# Patient Record
Sex: Female | Born: 1960 | Race: White | Hispanic: No | Smoking: Current every day smoker
Health system: Southern US, Community
[De-identification: ages and names within clinical notes are randomized; demographics above are authoritative.]

## PROBLEM LIST (undated history)

## (undated) DIAGNOSIS — J302 Other seasonal allergic rhinitis: Secondary | ICD-10-CM

## (undated) DIAGNOSIS — M1990 Unspecified osteoarthritis, unspecified site: Secondary | ICD-10-CM

## (undated) DIAGNOSIS — R5383 Other fatigue: Secondary | ICD-10-CM

## (undated) HISTORY — DX: Unspecified osteoarthritis, unspecified site: M19.90

## (undated) HISTORY — DX: Other seasonal allergic rhinitis: J30.2

## (undated) HISTORY — DX: Other fatigue: R53.83

---

## 2000-02-28 HISTORY — PX: ABDOMINAL HYSTERECTOMY: SHX81

## 2000-10-25 ENCOUNTER — Inpatient Hospital Stay (HOSPITAL_COMMUNITY): Admission: AD | Admit: 2000-10-25 | Discharge: 2000-10-27 | Payer: Self-pay | Admitting: *Deleted

## 2003-12-31 ENCOUNTER — Emergency Department (HOSPITAL_COMMUNITY): Admission: EM | Admit: 2003-12-31 | Discharge: 2003-12-31 | Payer: Self-pay | Admitting: Emergency Medicine

## 2004-07-19 ENCOUNTER — Ambulatory Visit (HOSPITAL_COMMUNITY): Admission: RE | Admit: 2004-07-19 | Discharge: 2004-07-19 | Payer: Self-pay | Admitting: Family Medicine

## 2004-07-22 ENCOUNTER — Ambulatory Visit (HOSPITAL_COMMUNITY): Admission: RE | Admit: 2004-07-22 | Discharge: 2004-07-22 | Payer: Self-pay | Admitting: Family Medicine

## 2011-08-12 ENCOUNTER — Ambulatory Visit (HOSPITAL_COMMUNITY)
Admission: RE | Admit: 2011-08-12 | Discharge: 2011-08-12 | Disposition: A | Discharge status: Home / Self Care | Payer: BC Managed Care – PPO | Source: Ambulatory Visit | Attending: Family Medicine | Admitting: Family Medicine

## 2011-08-12 ENCOUNTER — Other Ambulatory Visit (HOSPITAL_COMMUNITY): Payer: Self-pay | Admitting: Family Medicine

## 2011-08-12 DIAGNOSIS — T1490XA Injury, unspecified, initial encounter: Secondary | ICD-10-CM

## 2011-08-12 DIAGNOSIS — X58XXXA Exposure to other specified factors, initial encounter: Secondary | ICD-10-CM | POA: Insufficient documentation

## 2011-08-12 DIAGNOSIS — M79609 Pain in unspecified limb: Secondary | ICD-10-CM | POA: Insufficient documentation

## 2011-08-12 DIAGNOSIS — S62319A Displaced fracture of base of unspecified metacarpal bone, initial encounter for closed fracture: Secondary | ICD-10-CM | POA: Insufficient documentation

## 2011-10-25 ENCOUNTER — Encounter: Payer: Self-pay | Admitting: Internal Medicine

## 2011-11-23 ENCOUNTER — Ambulatory Visit (AMBULATORY_SURGERY_CENTER): Payer: BC Managed Care – PPO | Admitting: *Deleted

## 2011-11-23 ENCOUNTER — Encounter: Payer: Self-pay | Admitting: Internal Medicine

## 2011-11-23 VITALS — Ht 68.0 in | Wt 182.1 lb

## 2011-11-23 DIAGNOSIS — Z1211 Encounter for screening for malignant neoplasm of colon: Secondary | ICD-10-CM

## 2011-11-23 MED ORDER — MOVIPREP 100 G PO SOLR: 1.0000 | Freq: Once | ORAL | Status: DC

## 2011-12-06 ENCOUNTER — Ambulatory Visit (AMBULATORY_SURGERY_CENTER): Payer: BC Managed Care – PPO | Admitting: Internal Medicine

## 2011-12-06 ENCOUNTER — Encounter: Payer: Self-pay | Admitting: Internal Medicine

## 2011-12-06 VITALS — BP 127/96 | HR 64 | Temp 97.7°F | Resp 16 | Ht 68.0 in | Wt 182.0 lb

## 2011-12-06 DIAGNOSIS — D126 Benign neoplasm of colon, unspecified: Secondary | ICD-10-CM

## 2011-12-06 DIAGNOSIS — Z1211 Encounter for screening for malignant neoplasm of colon: Secondary | ICD-10-CM

## 2011-12-06 MED ORDER — SODIUM CHLORIDE 0.9 % IV SOLN: 500.0000 mL | INTRAVENOUS | Status: DC

## 2011-12-06 NOTE — Progress Notes (Signed)
No complaints noted in the recovery room. Maw   

## 2011-12-06 NOTE — Op Note (Signed)
[facility] [address] [place], [address]   COLONOSCOPY PROCEDURE REPORT  PATIENT: Jackie Hinton, Jackie Hinton  MR#: [id number] BIRTHDATE: [birth year] , 51  yrs. old GENDER: Female ENDOSCOPIST: Hart Carwin, MD REFERRED BY:  Karleen Hampshire, M.D. PROCEDURE DATE:  12/06/2011 PROCEDURE:   Colonoscopy with snare polypectomy ASA CLASS:   Class I INDICATIONS:average risk patient for colon cancer. MEDICATIONS: MAC sedation, administered by CRNA and Propofol (Diprivan) 350 mg IV  DESCRIPTION OF PROCEDURE:   After the risks and benefits and of the procedure were explained, informed consent was obtained.  A digital rectal exam revealed no abnormalities of the rectum.    The LB [id number]  endoscope was introduced through the anus and advanced to the cecum, which was identified by both the appendix and ileocecal valve .  The quality of the prep was good, using MoviPrep .  The instrument was then slowly withdrawn as the colon was fully examined.     COLON FINDINGS: A smooth sessile polyp ranging between 5-21mm in size was found in the sigmoid colon.  A polypectomy was performed with a cold snare.  The resection was complete and the polyp tissue was partially retrieved.   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     Retroflexed views revealed no abnormalities.     The scope was then withdrawn from the patient and the procedure completed.  COMPLICATIONS: There were no complications. ENDOSCOPIC IMPRESSION: 1.   Sessile polyp ranging between 5-95mm in size was found in the sigmoid colon at 25 cm, polypectomy was performed with a cold snare , polyp not recovered, the postpolypectomy site was biopsied to r/o dysplasia 2.   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  await pathology results 2.  High fiber diet   REPEAT EXAM: In 5 year(s)  for Colonoscopy.  cc:  _______________________________ eSignedHart Carwin, MD 12/06/2011 11:36 AM     PATIENT NAME:  Jackie Hinton, Jackie Hinton MR#: [id number]

## 2011-12-06 NOTE — Patient Instructions (Addendum)
Handouts were given to your care partner on polyp and high fiber diet.  You may resume your current medications today.  Please call if any questions or concerns.    YOU HAD AN ENDOSCOPIC PROCEDURE TODAY AT THE [facility]: Refer to the procedure report that was given to you for any specific questions about what was found during the examination.  If the procedure report does not answer your questions, please call your gastroenterologist to clarify.  If you requested that your care partner not be given the details of your procedure findings, then the procedure report has been included in a sealed envelope for you to review at your convenience later.  YOU SHOULD EXPECT: Some feelings of bloating in the abdomen. Passage of more gas than usual.  Walking can help get rid of the air that was put into your GI tract during the procedure and reduce the bloating. If you had a lower endoscopy (such as a colonoscopy or flexible sigmoidoscopy) you may notice spotting of blood in your stool or on the toilet paper. If you underwent a bowel prep for your procedure, then you may not have a normal bowel movement for a few days.  DIET: Your first meal following the procedure should be a light meal and then it is ok to progress to your normal diet.  A half-sandwich or bowl of soup is an example of a good first meal.  Heavy or fried foods are harder to digest and may make you feel nauseous or bloated.  Likewise meals heavy in dairy and vegetables can cause extra gas to form and this can also increase the bloating.  Drink plenty of fluids but you should avoid alcoholic beverages for 24 hours.  ACTIVITY: Your care partner should take you home directly after the procedure.  You should plan to take it easy, moving slowly for the rest of the day.  You can resume normal activity the day after the procedure however you should NOT DRIVE or use heavy machinery for 24 hours (because of the sedation medicines used during the  test).    SYMPTOMS TO REPORT IMMEDIATELY: A gastroenterologist can be reached at any hour.  During normal business hours, 8:30 AM to 5:00 PM Monday through Friday, call [phone].  After hours and on weekends, please call the GI answering service at [phone] who will take a message and have the physician on call contact you.   Following lower endoscopy (colonoscopy or flexible sigmoidoscopy):  Excessive amounts of blood in the stool  Significant tenderness or worsening of abdominal pains  Swelling of the abdomen that is new, acute  Fever of 100F or higher    FOLLOW UP: If any biopsies were taken you will be contacted by phone or by letter within the next 1-3 weeks.  Call your gastroenterologist if you have not heard about the biopsies in 3 weeks.  Our staff will call the home number listed on your records the next business day following your procedure to check on you and address any questions or concerns that you may have at that time regarding the information given to you following your procedure. This is a courtesy call and so if there is no answer at the home number and we have not heard from you through the emergency physician on call, we will assume that you have returned to your regular daily activities without incident.  SIGNATURES/CONFIDENTIALITY: You and/or your care partner have signed paperwork which will be entered into your  electronic medical record.  These signatures attest to the fact that that the information above on your After Visit Summary has been reviewed and is understood.  Full responsibility of the confidentiality of this discharge information lies with you and/or your care-partner.

## 2011-12-06 NOTE — Progress Notes (Signed)
Patient did not experience any of the following events: a burn prior to discharge; a fall within the facility; wrong site/side/patient/procedure/implant event; or a hospital transfer or hospital admission upon discharge from the facility. (G8907) Patient did not have preoperative order for IV antibiotic SSI prophylaxis. (G8918)  

## 2011-12-07 ENCOUNTER — Telehealth: Payer: Self-pay | Admitting: *Deleted

## 2011-12-07 NOTE — Telephone Encounter (Signed)
  Follow up Call-  Call back number 12/06/2011  Post procedure Call Back phone  # [phone]  Permission to leave phone message Yes     Patient questions:  Do you have a fever, pain , or abdominal swelling? no Pain Score  0 *  Have you tolerated food without any problems? yes  Have you been able to return to your normal activities? yes  Do you have any questions about your discharge instructions: Diet   no Medications  no Follow up visit  no  Do you have questions or concerns about your Care? no  Actions: * If pain score is 4 or above: No action needed, pain <4.

## 2011-12-11 ENCOUNTER — Encounter: Payer: Self-pay | Admitting: Internal Medicine

## 2014-09-03 ENCOUNTER — Encounter: Payer: Self-pay | Admitting: Adult Health

## 2014-09-03 ENCOUNTER — Ambulatory Visit (INDEPENDENT_AMBULATORY_CARE_PROVIDER_SITE_OTHER): Payer: BLUE CROSS/BLUE SHIELD | Admitting: Adult Health

## 2014-09-03 VITALS — BP 120/68 | HR 76 | Ht 66.5 in | Wt 201.0 lb

## 2014-09-03 DIAGNOSIS — Z1212 Encounter for screening for malignant neoplasm of rectum: Secondary | ICD-10-CM | POA: Diagnosis not present

## 2014-09-03 DIAGNOSIS — R5383 Other fatigue: Secondary | ICD-10-CM

## 2014-09-03 DIAGNOSIS — Z01419 Encounter for gynecological examination (general) (routine) without abnormal findings: Secondary | ICD-10-CM

## 2014-09-03 HISTORY — DX: Other fatigue: R53.83

## 2014-09-03 LAB — HEMOCCULT GUIAC POC 1CARD (OFFICE): FECAL OCCULT BLD: NEGATIVE

## 2014-09-03 NOTE — Progress Notes (Signed)
Patient ID: Jackie Hinton, female   DOB: 06/14/1960, 54 y.o.   MRN: [id number] History of Present Illness: Jackie Hinton is a 54 year old white female, divorced in for well woman gyn exam,she is sp hysterectomy.Had hysterectomy in 2003 by Dr Isaiah Blakes for fibroids. PCP is Dr Hilma Favors.  Current Medications, Allergies, Past Medical History, Past Surgical History, Family History and Social History were reviewed in [employer] record.     Review of Systems: Patient denies any headaches, hearing loss, blurred vision, shortness of breath, chest pain, abdominal pain, problems with bowel movements, urination, or intercourse(not having sex). No joint pain or mood swings.Has fatigue but works 8-11 hours per day.   Physical Exam:BP 120/68 mmHg  Pulse 76  Ht 5' 6.5" (1.689 m)  Wt 201 lb (91.173 kg)  BMI 31.96 kg/m2 General:  Well developed, well nourished, no acute distress Skin:  Warm and dry Neck:  Midline trachea, normal thyroid, good ROM, no lymphadenopathy Lungs; Clear to auscultation bilaterally Breast:  No dominant palpable mass, retraction, or nipple discharge Cardiovascular: Regular rate and rhythm Abdomen:  Soft, non tender, no hepatosplenomegaly Pelvic:  External genitalia is normal in appearance, except has numerous sebaceous cysts.  The vagina has decreased color, moisture and rugae.Marland Kitchen Urethra has no lesions or masses. The cervix and uterus are absent.  No adnexal masses or tenderness noted.Bladder is non tender, no masses felt. Rectal: Good sphincter tone, no polyps, internal hemorrhoids felt.  Hemoccult negative. Extremities/musculoskeletal:  No swelling or varicosities noted, no clubbing or cyanosis Psych:  No mood changes, alert and cooperative,seems happy   Impression:  Well woman gyn exam no pap Fatigue    Plan: Physical in 1 year Mammogram now and yearly(past due) Colonoscopy per GI Check CBC,CMP,TSH and lipids

## 2014-09-03 NOTE — Patient Instructions (Signed)
Get mammogram now Physical in 1 year Will talk when labs back

## 2014-09-05 LAB — COMPREHENSIVE METABOLIC PANEL
ALBUMIN: 4.2 g/dL (ref 3.5–5.5)
ALT: 32 IU/L (ref 0–32)
AST: 30 IU/L (ref 0–40)
Albumin/Globulin Ratio: 2 (ref 1.1–2.5)
Alkaline Phosphatase: 79 IU/L (ref 39–117)
BILIRUBIN TOTAL: 0.5 mg/dL (ref 0.0–1.2)
BUN/Creatinine Ratio: 19 (ref 9–23)
BUN: 16 mg/dL (ref 6–24)
CALCIUM: 9.1 mg/dL (ref 8.7–10.2)
CHLORIDE: 107 mmol/L (ref 97–108)
CO2: 22 mmol/L (ref 18–29)
Creatinine, Ser: 0.86 mg/dL (ref 0.57–1.00)
GFR calc non Af Amer: 77 mL/min/{1.73_m2} (ref >59)
GFR, EST AFRICAN AMERICAN: 89 mL/min/{1.73_m2} (ref >59)
GLUCOSE: 89 mg/dL (ref 65–99)
Globulin, Total: 2.1 g/dL (ref 1.5–4.5)
POTASSIUM: 4.5 mmol/L (ref 3.5–5.2)
Sodium: 145 mmol/L — ABNORMAL HIGH (ref 134–144)
TOTAL PROTEIN: 6.3 g/dL (ref 6.0–8.5)

## 2014-09-05 LAB — CBC
HEMATOCRIT: 36.9 % (ref 34.0–46.6)
Hemoglobin: 12.6 g/dL (ref 11.1–15.9)
MCH: 30.9 pg (ref 26.6–33.0)
MCHC: 34.1 g/dL (ref 31.5–35.7)
MCV: 90 fL (ref 79–97)
Platelets: 303 10*3/uL (ref 150–379)
RBC: 4.08 x10E6/uL (ref 3.77–5.28)
RDW: 13.9 % (ref 12.3–15.4)
WBC: 6.5 10*3/uL (ref 3.4–10.8)

## 2014-09-05 LAB — TSH: TSH: 1.8 u[IU]/mL (ref 0.450–4.500)

## 2014-09-05 LAB — LIPID PANEL W/O CHOL/HDL RATIO
CHOLESTEROL TOTAL: 212 mg/dL — AB (ref 100–199)
HDL: 74 mg/dL (ref >39)
LDL Calculated: 122 mg/dL — ABNORMAL HIGH (ref 0–99)
TRIGLYCERIDES: 81 mg/dL (ref 0–149)
VLDL Cholesterol Cal: 16 mg/dL (ref 5–40)

## 2014-09-07 ENCOUNTER — Telehealth: Payer: Self-pay | Admitting: Adult Health

## 2014-09-07 NOTE — Telephone Encounter (Signed)
Left message about labs

## 2014-09-21 ENCOUNTER — Other Ambulatory Visit (HOSPITAL_COMMUNITY): Payer: Self-pay | Admitting: Internal Medicine

## 2014-09-21 DIAGNOSIS — Z1231 Encounter for screening mammogram for malignant neoplasm of breast: Secondary | ICD-10-CM

## 2014-10-05 ENCOUNTER — Ambulatory Visit (HOSPITAL_COMMUNITY)
Admission: RE | Admit: 2014-10-05 | Discharge: 2014-10-05 | Disposition: A | Discharge status: Home / Self Care | Payer: BLUE CROSS/BLUE SHIELD | Source: Ambulatory Visit | Attending: Internal Medicine | Admitting: Internal Medicine

## 2014-10-05 DIAGNOSIS — Z1231 Encounter for screening mammogram for malignant neoplasm of breast: Secondary | ICD-10-CM | POA: Diagnosis not present

## 2016-09-05 IMAGING — MG MM DIGITAL SCREENING BILAT W/ CAD
4 series · 4 of 4 positions shown · non-contrast
Comparison: None.

CLINICAL DATA: Screening.

EXAM:
DIGITAL SCREENING BILATERAL MAMMOGRAM WITH CAD

[R MLO]
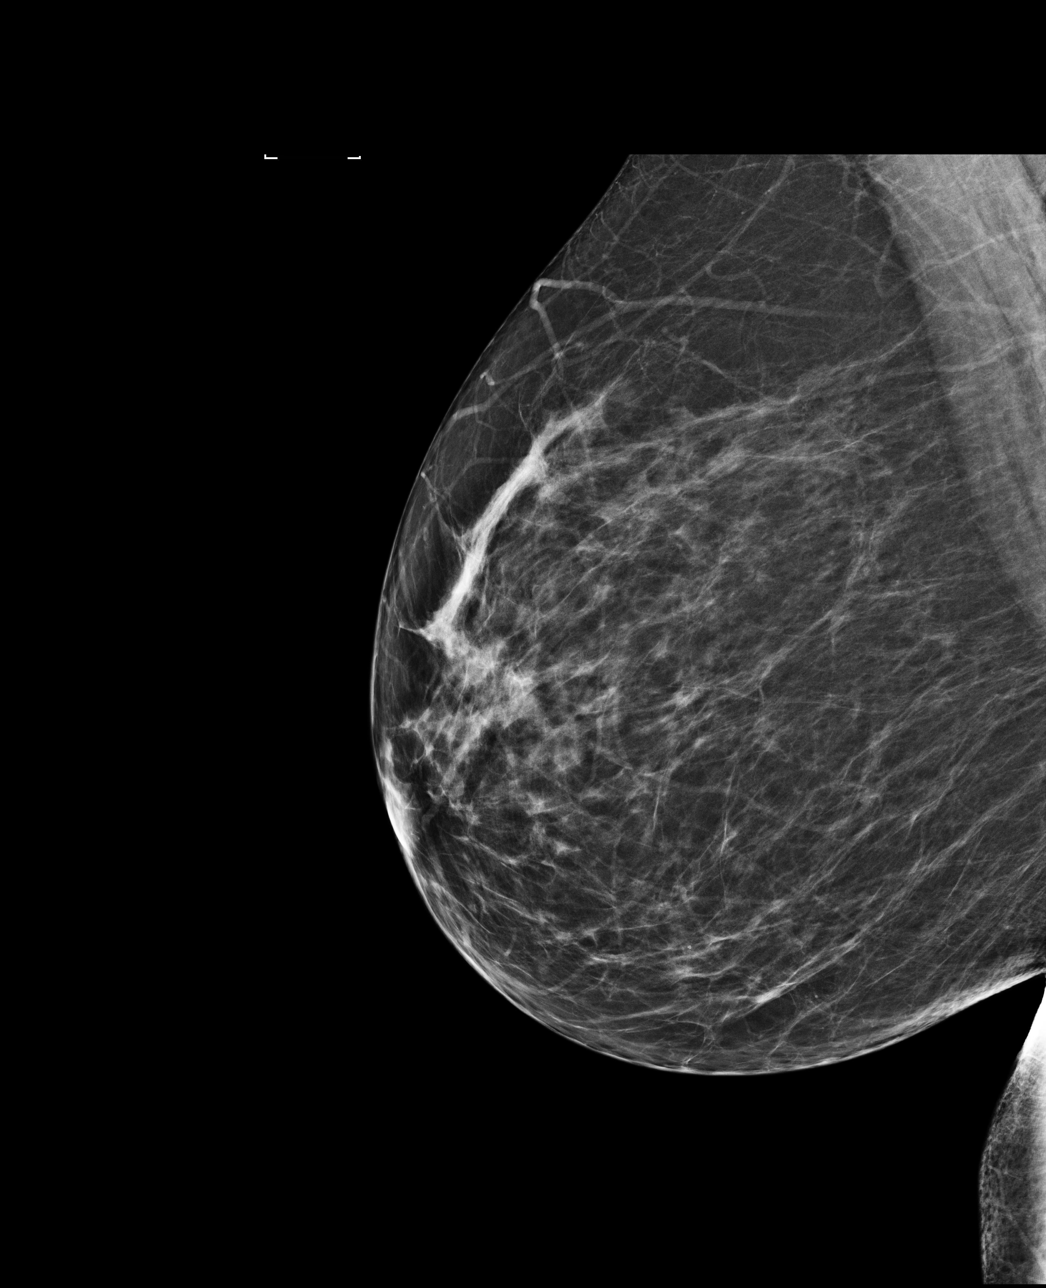

[R CC]
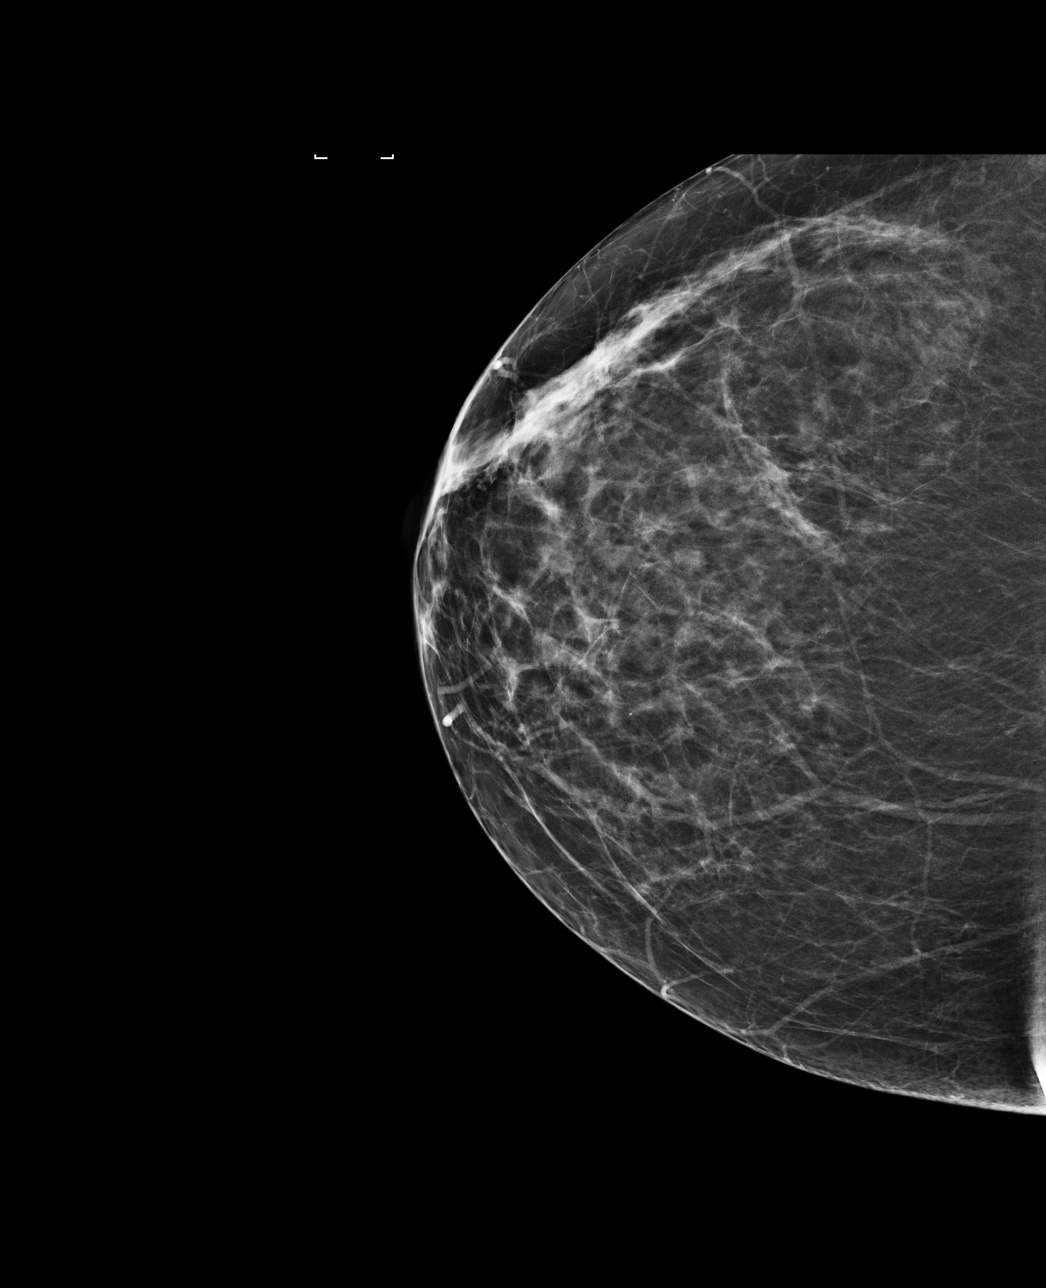

[L CC]
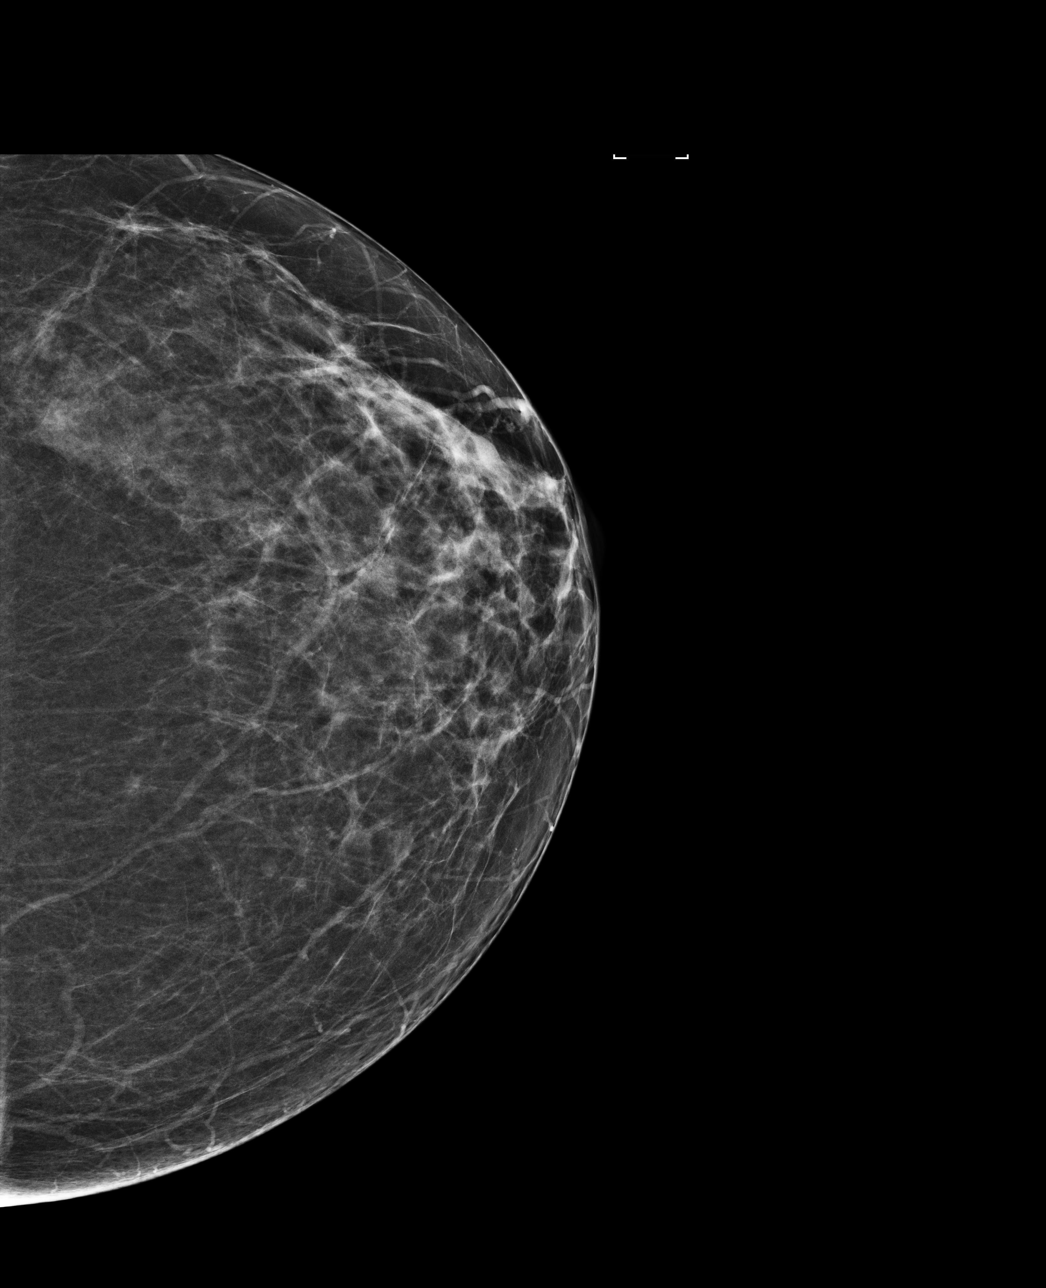

[L MLO]
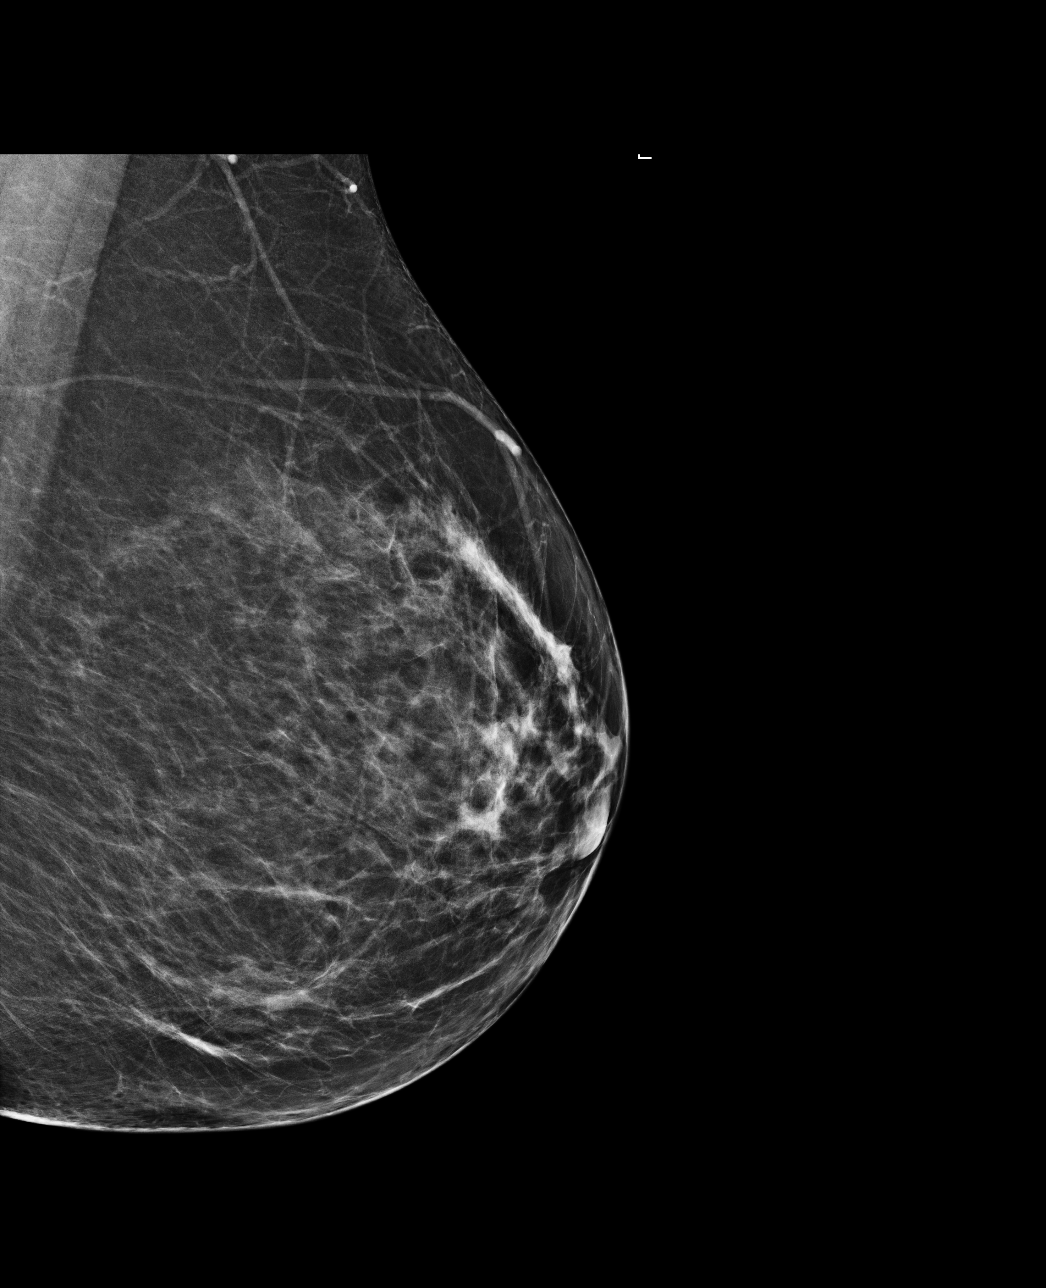

[4 of 4 positions shown; findings below may reference images not displayed]

ACR Breast Density Category b: There are scattered areas of
fibroglandular density.
FINDINGS: There are no findings suspicious for malignancy. Images were
processed with CAD.
IMPRESSION: No mammographic evidence of malignancy. A result letter of this
screening mammogram will be mailed directly to the patient.

RECOMMENDATION:
Screening mammogram in one year. (Code:[id number])

BI-RADS CATEGORY  1: Negative.

## 2017-01-10 DIAGNOSIS — Z0000 Encounter for general adult medical examination without abnormal findings: Secondary | ICD-10-CM | POA: Diagnosis not present

## 2017-03-31 DIAGNOSIS — J019 Acute sinusitis, unspecified: Secondary | ICD-10-CM | POA: Diagnosis not present

## 2017-03-31 DIAGNOSIS — J209 Acute bronchitis, unspecified: Secondary | ICD-10-CM | POA: Diagnosis not present

## 2017-12-24 ENCOUNTER — Ambulatory Visit
Admission: RE | Admit: 2017-12-24 | Discharge: 2017-12-24 | Disposition: A | Discharge status: Home / Self Care | Payer: 59 | Source: Ambulatory Visit | Attending: Family Medicine | Admitting: Family Medicine

## 2017-12-24 ENCOUNTER — Other Ambulatory Visit: Payer: Self-pay | Admitting: Family Medicine

## 2017-12-24 DIAGNOSIS — Z1231 Encounter for screening mammogram for malignant neoplasm of breast: Secondary | ICD-10-CM

## 2018-02-11 DIAGNOSIS — J0110 Acute frontal sinusitis, unspecified: Secondary | ICD-10-CM | POA: Diagnosis not present

## 2018-04-30 DIAGNOSIS — Z6833 Body mass index (BMI) 33.0-33.9, adult: Secondary | ICD-10-CM | POA: Diagnosis not present

## 2020-09-14 ENCOUNTER — Other Ambulatory Visit: Payer: Self-pay | Admitting: Family Medicine

## 2020-09-14 DIAGNOSIS — G500 Trigeminal neuralgia: Secondary | ICD-10-CM

## 2020-09-19 ENCOUNTER — Other Ambulatory Visit: Payer: Self-pay

## 2020-09-19 ENCOUNTER — Ambulatory Visit
Admission: RE | Admit: 2020-09-19 | Discharge: 2020-09-19 | Disposition: A | Discharge status: Home / Self Care | Payer: 59 | Source: Ambulatory Visit | Attending: Family Medicine | Admitting: Family Medicine

## 2020-09-19 DIAGNOSIS — G500 Trigeminal neuralgia: Secondary | ICD-10-CM

## 2020-09-19 MED ORDER — GADOBENATE DIMEGLUMINE 529 MG/ML IV SOLN
20.0000 mL | Freq: Once | INTRAVENOUS | Status: AC | PRN
  Administered 2020-09-19: 20 mL via INTRAVENOUS

## 2021-02-28 ENCOUNTER — Emergency Department (HOSPITAL_COMMUNITY)
Admission: EM | Admit: 2021-02-28 | Discharge: 2021-02-28 | Disposition: A | Discharge status: Home / Self Care | Payer: 59 | Attending: Emergency Medicine | Admitting: Emergency Medicine

## 2021-02-28 ENCOUNTER — Encounter (HOSPITAL_COMMUNITY): Payer: Self-pay | Admitting: *Deleted

## 2021-02-28 DIAGNOSIS — R519 Headache, unspecified: Secondary | ICD-10-CM | POA: Diagnosis present

## 2021-02-28 DIAGNOSIS — G500 Trigeminal neuralgia: Secondary | ICD-10-CM | POA: Insufficient documentation

## 2021-02-28 MED ORDER — CARBAMAZEPINE 200 MG PO TABS
200.0000 mg | ORAL_TABLET | Freq: Once | ORAL | Status: AC
Start: 2021-02-28 — End: 2021-02-28
  Administered 2021-02-28: 200 mg via ORAL
  Filled 2021-02-28: qty 1

## 2021-02-28 MED ORDER — CARBAMAZEPINE 200 MG PO TABS: 200.0000 mg | ORAL_TABLET | Freq: Two times a day (BID) | ORAL | 0 refills | Status: DC

## 2021-02-28 NOTE — ED Provider Notes (Signed)
[facility] EMERGENCY DEPARTMENT Provider Note  CSN: [id number] Arrival date & time: 02/28/21 1648  History Chief Complaint  Patient presents with   Facial Pain    Jackie Hinton is a 61 y.o. female with prior diagnosis of trigeminal neuralgia managed by a doctor in [place], taking Gabapentin for same reports she was doing well until yesterday when she had onset of worsening pain similar to previous however she points to the back of her head as the origin of the pain which then radiates up and over the top of her head and into her R face. She reports some increased tearing, pain is worse with talking/chewing. She also has TMJ. No fevers, no blurry vision, no injuries.    Home Medications Prior to Admission medications   Medication Sig Start Date End Date Taking? Authorizing Provider  carbamazepine (TEGRETOL) 200 MG tablet Take 1 tablet (200 mg total) by mouth 2 (two) times daily. 02/28/21 03/30/21 Yes Truddie Hidden, MD  clobetasol cream (TEMOVATE) 3.87 % 1 application 2 (two) times daily.  10/11/11   [provider]  ibuprofen (ADVIL,MOTRIN) 200 MG tablet Take 200 mg by mouth every 6 (six) hours as needed.    [provider]     Allergies    Patient has no known allergies.   Review of Systems   Review of Systems Please see HPI for pertinent positives and negatives  Physical Exam BP (!) 166/79 (BP Location: Right Arm)    Pulse 82    Temp 97.7 F (36.5 C) (Oral)    Resp 16    SpO2 97%   Physical Exam Vitals and nursing note reviewed.  Constitutional:      Appearance: Normal appearance.  HENT:     Head: Normocephalic and atraumatic.     Nose: Nose normal.     Mouth/Throat:     Mouth: Mucous membranes are moist.  Eyes:     Extraocular Movements: Extraocular movements intact.     Conjunctiva/sclera: Conjunctivae normal.  Cardiovascular:     Rate and Rhythm: Normal rate.  Pulmonary:     Effort: Pulmonary effort is normal.     Breath sounds: Normal breath  sounds.  Abdominal:     General: Abdomen is flat.     Palpations: Abdomen is soft.     Tenderness: There is no abdominal tenderness.  Musculoskeletal:        General: No swelling. Normal range of motion.     Cervical back: Neck supple.  Skin:    General: Skin is warm and dry.  Neurological:     General: No focal deficit present.     Mental Status: She is alert.  Psychiatric:        Mood and Affect: Mood normal.    ED Results / Procedures / Treatments   EKG None  Procedures Procedures  Medications Ordered in the ED Medications  carbamazepine (TEGRETOL) tablet 200 mg (has no administration in time range)    Initial Impression and Plan  Patient with prior history of trigeminal neuralgia although her symptoms also have component more consistent with occipital neuralgia. She had an outpatient MRI done in July 2022 at [facility] showing:  "IMPRESSION: 1. Small vessel closely approximating the right trigeminal root entry zone (series 14, image 42), which is nonspecific but represents a potential cause of trigeminal neuralgia in this patient with right-sided facial pain. High-resolution imaging through the brainstem/trigeminal nerves could further characterize if clinically indicated. 2. Partially empty sella, which is  often a normal anatomic variant but can be associated with idiopathic intracranial hypertension (pseudotumor cerebri)."  She reports this has not been followed up on. She has never been given Tegretol for her pain. She does not see a neurologist.   ED Course   Clinical Course as of 02/28/21 2050  Mon Feb 28, 2021  2046 Discussed with the patient that her symptoms may be a combination of occipital neuralgia, trigeminal neuralgia and/or TMJ. She has not been on Tegretol in the past so will begin that to see if she gets some relief. She would also likely benefit from Neurology evaluation, referred to Dr. Merlene Laughter. RTED for any other concerns.  [CS]    Clinical  Course User Index [CS] Truddie Hidden, MD     MDM Rules/Calculators/A&P Medical Decision Making Problems Addressed: Trigeminal neuralgia of right side of face: chronic illness or injury with exacerbation, progression, or side effects of treatment  Amount and/or Complexity of Data Reviewed External Data Reviewed: radiology.  Risk Prescription drug management.    Final Clinical Impression(s) / ED Diagnoses Final diagnoses:  Trigeminal neuralgia of right side of face    Rx / DC Orders ED Discharge Orders          Ordered    carbamazepine (TEGRETOL) 200 MG tablet  2 times daily        02/28/21 2049             Truddie Hidden, MD 02/28/21 2050

## 2021-02-28 NOTE — ED Triage Notes (Signed)
Facial pain, history of trigeminal neuralgia

## 2021-04-18 ENCOUNTER — Ambulatory Visit: Payer: BLUE CROSS/BLUE SHIELD | Admitting: Neurology

## 2021-10-11 ENCOUNTER — Telehealth: Payer: Self-pay | Admitting: Orthopedic Surgery

## 2021-10-11 NOTE — Telephone Encounter (Signed)
Patient left a message requesting to schedule appointment with Dr Aline Brochure - reached voice mail, left message.

## 2021-10-11 NOTE — Telephone Encounter (Signed)
Patient called back, aware of appointment scheduled.

## 2021-11-07 ENCOUNTER — Ambulatory Visit (INDEPENDENT_AMBULATORY_CARE_PROVIDER_SITE_OTHER): Payer: 59 | Admitting: Orthopedic Surgery

## 2021-11-07 ENCOUNTER — Ambulatory Visit (INDEPENDENT_AMBULATORY_CARE_PROVIDER_SITE_OTHER): Payer: 59

## 2021-11-07 VITALS — BP 188/93 | HR 88 | Ht 68.0 in | Wt 240.0 lb

## 2021-11-07 DIAGNOSIS — M25562 Pain in left knee: Secondary | ICD-10-CM

## 2021-11-07 DIAGNOSIS — M1712 Unilateral primary osteoarthritis, left knee: Secondary | ICD-10-CM

## 2021-11-07 DIAGNOSIS — G8929 Other chronic pain: Secondary | ICD-10-CM

## 2021-11-07 NOTE — Progress Notes (Signed)
Chief Complaint  Patient presents with   Knee Pain    Left, pain past two months drove to [place] with one 45 minute stop and pain started after that    New patient evaluation  61 year old female drove to [place] for 8 hours only stopped once when she got out her knee started having pain and swelling she says it feels like the kneecap is moving.  This pain shoots up the back of her leg.  She denies any injury.  She took Advil that helped very little  Examination shows a well-developed well-nourished female alert and oriented x3 mood and affect normal  BP (!) 188/93   Pulse 88   Ht '5\' 8"'$  (1.727 m)   Wt 240 lb (108.9 kg)   BMI 36.49 kg/m   Her left knee has a mild effusion she is tender across the front of the knee and in the knee With crepitance on range of motion.  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  Recommend home exercise program for strengthening  She can start meloxicam  Reevaluate the knee in 4 weeks    Past Medical History:  Diagnosis Date   Arthritis    bilateral knees   Fatigue 09/03/2014   Seasonal allergies    allergic to cat as a child   Past Surgical History:  Procedure Laterality Date   ABDOMINAL HYSTERECTOMY  2002   [place], 1990   Family History  Problem Relation Age of Onset   Lung cancer Paternal Grandfather    Rectal cancer Neg Hx    Stomach cancer Neg Hx    Esophageal cancer Neg Hx    Colon cancer Neg Hx    COPD Mother    Emphysema Mother    Stroke Mother    COPD Father    Emphysema Father    Diabetes Father    Diabetes Sister    Hypertension Son    Bipolar disorder Sister    Social History   Tobacco Use   Smoking status: Every Day    Packs/day: 0.50    Years: 10.00    Total pack years: 5.00    Types: Cigarettes   Smokeless tobacco: Never  Substance Use  Topics   Alcohol use: Yes    Comment: very seldom   Drug use: No   No Known Allergies

## 2021-11-08 ENCOUNTER — Telehealth: Payer: Self-pay | Admitting: Orthopedic Surgery

## 2021-11-08 NOTE — Telephone Encounter (Signed)
Patient called about medication which she said  Dr Aline Brochure was going to prescribe per her office visit 11/07/21; states is not at her pharmacy, [facility] in [place]. Chart visit note shows: 'She can start meloxicam' - please advise.

## 2021-11-09 MED ORDER — MELOXICAM 7.5 MG PO TABS: 7.5000 mg | ORAL_TABLET | Freq: Every day | ORAL | 5 refills | Status: DC

## 2021-11-09 NOTE — Addendum Note (Signed)
Addended by: Carole Civil on: 11/09/2021 08:12 AM   Modules accepted: Orders

## 2021-11-21 ENCOUNTER — Other Ambulatory Visit: Payer: Self-pay | Admitting: Family Medicine

## 2021-11-21 DIAGNOSIS — Z1231 Encounter for screening mammogram for malignant neoplasm of breast: Secondary | ICD-10-CM

## 2021-11-28 ENCOUNTER — Ambulatory Visit
Admission: RE | Admit: 2021-11-28 | Discharge: 2021-11-28 | Disposition: A | Discharge status: Home / Self Care | Payer: 59 | Source: Ambulatory Visit | Attending: Family Medicine | Admitting: Family Medicine

## 2021-11-28 ENCOUNTER — Encounter: Payer: Self-pay | Admitting: Orthopedic Surgery

## 2021-11-28 ENCOUNTER — Ambulatory Visit (INDEPENDENT_AMBULATORY_CARE_PROVIDER_SITE_OTHER): Payer: 59 | Admitting: Orthopedic Surgery

## 2021-11-28 VITALS — Ht 68.0 in | Wt 236.4 lb

## 2021-11-28 DIAGNOSIS — M25562 Pain in left knee: Secondary | ICD-10-CM | POA: Diagnosis not present

## 2021-11-28 DIAGNOSIS — Z1231 Encounter for screening mammogram for malignant neoplasm of breast: Secondary | ICD-10-CM

## 2021-11-28 NOTE — Patient Instructions (Signed)
Smoking Tobacco Information, Adult Smoking tobacco can be harmful to your health. Tobacco contains a toxic colorless chemical called nicotine. Nicotine causes changes in your brain that make you want more and more. This is called addiction. This can make it hard to stop smoking once you start. Tobacco also has other toxic chemicals that can hurt your body and raise your risk of many cancers. Menthol or "lite" tobacco or cigarette brands are not safer than regular brands. How can smoking tobacco affect me? Smoking tobacco puts you at risk for: Cancer. Smoking is most commonly associated with lung cancer, but can also lead to cancer in other parts of the body. Chronic obstructive pulmonary disease (COPD). This is a long-term lung condition that makes it hard to breathe. It also gets worse over time. High blood pressure (hypertension), heart disease, stroke, heart attack, and lung infections, such as pneumonia. Cataracts. This is when the lenses in the eyes become clouded. Digestive problems. This may include peptic ulcers, heartburn, and gastroesophageal reflux disease (GERD). Oral health problems, such as gum disease, mouth sores, and tooth loss. Loss of taste and smell. Smoking also affects how you look and smell. Smoking may cause: Wrinkles. Yellow or stained teeth, fingers, and fingernails. Bad breath. Bad-smelling clothes and hair. Smoking tobacco can also affect your social life, because: It may be challenging to find places to smoke when away from home. Many workplaces, restaurants, hotels, and public places are tobacco-free. Smoking is expensive. This is due to the cost of tobacco and the long-term costs of treating health problems from smoking. Secondhand smoke may affect those around you. Secondhand smoke can cause lung cancer, breathing problems, and heart disease. Children of smokers have a higher risk for: Sudden infant death syndrome (SIDS). Ear infections. Lung infections. What  actions can I take to prevent health problems? Quit smoking  Do not start smoking. Quit if you already smoke. Do not replace cigarette smoking with vaping devices, such as e-cigarettes. Make a plan to quit smoking and commit to it. Look for programs to help you, and ask your health care provider for recommendations and ideas. Set a date and write down all the reasons you want to quit. Let your friends and family know you are quitting so they can help and support you. Consider finding friends who also want to quit. It can be easier to quit with someone else, so that you can support each other. Talk with your health care provider about using nicotine replacement medicines to help you quit. These include gum, lozenges, patches, sprays, or pills. If you try to quit but return to smoking, stay positive. It is common to slip up when you first quit, so take it one day at a time. Be prepared for cravings. When you feel the urge to smoke, chew gum or suck on hard candy. Lifestyle Stay busy. Take care of your body. Get plenty of exercise, eat a healthy diet, and drink plenty of water. Find ways to manage your stress, such as meditation, yoga, exercise, or time spent with friends and family. Ask your health care provider about having regular tests (screenings) to check for cancer. This may include blood tests, imaging tests, and other tests. Where to find support To get support to quit smoking, consider: Asking your health care provider for more information and resources. Joining a support group for people who want to quit smoking in your local community. There are many effective programs that may help you to quit. Calling the smokefree.gov counselor   helpline at 1-800-QUIT-NOW (1-800-784-8669). Where to find more information You may find more information about quitting smoking from: Centers for Disease Control and Prevention: cdc.gov/tobacco Smokefree.gov: smokefree.gov American Lung Association:  freedomfromsmoking.org Contact a health care provider if: You have problems breathing. Your lips, nose, or fingers turn blue. You have chest pain. You are coughing up blood. You feel like you will faint. You have other health changes that cause you to worry. Summary Smoking tobacco can negatively affect your health, the health of those around you, your finances, and your social life. Do not start smoking. Quit if you already smoke. If you need help quitting, ask your health care provider. Consider joining a support group for people in your local community who want to quit smoking. There are many effective programs that may help you to quit. This information is not intended to replace advice given to you by your health care provider. Make sure you discuss any questions you have with your health care provider. Document Revised: 02/08/2021 Document Reviewed: 02/08/2021 Elsevier Patient Education  2023 Elsevier Inc.  

## 2021-11-28 NOTE — Progress Notes (Signed)
Chief Complaint  Patient presents with   Knee Pain    L/ it feels so much better.   61 year old female drove to [place] for 8 hours only stopped once when she got out her knee started having pain and swelling she says it feels like the kneecap is moving.  This pain shoots up the back of her leg.  She denies any injury.  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   Acute pain of left knee Yes    Fu prn

## 2021-12-16 ENCOUNTER — Encounter: Payer: Self-pay | Admitting: Internal Medicine

## 2022-04-27 ENCOUNTER — Encounter: Payer: Self-pay | Admitting: Radiology

## 2022-06-04 ENCOUNTER — Other Ambulatory Visit: Payer: Self-pay | Admitting: Orthopedic Surgery

## 2022-08-24 ENCOUNTER — Encounter: Payer: Self-pay | Admitting: *Deleted

## 2022-11-21 ENCOUNTER — Other Ambulatory Visit: Payer: Self-pay | Admitting: Family Medicine

## 2022-11-21 DIAGNOSIS — Z1231 Encounter for screening mammogram for malignant neoplasm of breast: Secondary | ICD-10-CM

## 2022-12-04 ENCOUNTER — Telehealth: Payer: Self-pay | Admitting: Neurology

## 2022-12-04 NOTE — Telephone Encounter (Signed)
Pt rescheduled appt due to being out town.

## 2022-12-07 ENCOUNTER — Ambulatory Visit: Payer: 59 | Admitting: Neurology

## 2022-12-25 ENCOUNTER — Ambulatory Visit
Admission: RE | Admit: 2022-12-25 | Discharge: 2022-12-25 | Disposition: A | Discharge status: Home / Self Care | Payer: 59 | Source: Ambulatory Visit | Attending: Family Medicine | Admitting: Family Medicine

## 2022-12-25 DIAGNOSIS — Z1231 Encounter for screening mammogram for malignant neoplasm of breast: Secondary | ICD-10-CM

## 2023-01-23 ENCOUNTER — Other Ambulatory Visit: Payer: Self-pay | Admitting: Orthopedic Surgery

## 2023-02-27 ENCOUNTER — Encounter (INDEPENDENT_AMBULATORY_CARE_PROVIDER_SITE_OTHER): Payer: Self-pay | Admitting: *Deleted

## 2023-03-06 ENCOUNTER — Telehealth: Payer: Self-pay | Admitting: Neurology

## 2023-03-06 NOTE — Telephone Encounter (Signed)
 Pt called to verify appointment

## 2023-03-08 ENCOUNTER — Encounter: Payer: Self-pay | Admitting: *Deleted

## 2023-03-19 ENCOUNTER — Encounter: Payer: Self-pay | Admitting: Neurology

## 2023-03-19 ENCOUNTER — Ambulatory Visit (INDEPENDENT_AMBULATORY_CARE_PROVIDER_SITE_OTHER): Payer: 59 | Admitting: Neurology

## 2023-03-19 VITALS — BP 155/79 | HR 66 | Resp 15 | Ht 68.0 in | Wt 228.5 lb

## 2023-03-19 DIAGNOSIS — R519 Headache, unspecified: Secondary | ICD-10-CM | POA: Insufficient documentation

## 2023-03-19 MED ORDER — CARBAMAZEPINE 200 MG PO TABS: 200.0000 mg | ORAL_TABLET | Freq: Every day | ORAL | 6 refills | Status: DC

## 2023-03-19 NOTE — Progress Notes (Unsigned)
Chief Complaint  Patient presents with   New Patient (Initial Visit)    Rm15, alone, NP REFERRAL FROM [facility] [place] [phone]/[id number] neuralgia: former neuro md suspiciously closed and needs to establish care. Pt stated had right sided head dull pain like similar to foot going to sleep and occasional numbness        ASSESSMENT AND PLAN  Jackie Hinton is a 63 y.o. female   History of intermittent to right sided headache, transient sharp radiating pain,  The history are not typical for trigeminal neuralgia, have some migraine features MRI of the brain showed no significant abnormalities Refilled her Tegretol 200 mg daily, if she continue to do well, may consider slow tapering off  Will continue follow-up with her primary care, only return to clinic for recurrent symptoms,   DIAGNOSTIC DATA (LABS, IMAGING, TESTING) - I reviewed patient records, labs, notes, testing and imaging myself where available.   MEDICAL HISTORY:  Jackie Hinton, is a 63 year old female, seen in request by her primary care from [facility] Dr. Mayford Knife, [place] [facility] for evaluation of right facial pain, initial evaluation was on March 19, 2023   History is obtained from the patient and review of electronic medical records. I personally reviewed pertinent available imaging films in PACS.   PMHx of  Depression.   Over the past 2 years, she has intermittent right side pain, often involving right parietal region, feels numb, also with intermittent shooting sharp pain from the occipital region, was under the care of of Dr. Gerilyn Pilgrim in the past, was given the diagnosis of trigeminal neuralgia,  Sometimes flareup can be so severe, she presented to the emergency room January 2023, the pain was described at the back part of her head, radiating towards the top of her head, into her right face, some tearing, some TMJ joints pain  Had MRI of the brain with and without contrast  July 2022, small vessel closely approximating the right trigeminal nerve root entry zone, partial empty sella,  Over the years, she was treated with Tegretol, was on 200 mg twice a day, now only once a day, has been pain-free since 2023  But she is worried about tapering off the medication because the severity of the pain when it did occur  She denies a history of migraine headaches,  I am concerned about the long-term side effect of Tegretol, which is an enzyme inducer, recommended gabapentin, she worry about potential side effect, because of bad experience of other family members.    PHYSICAL EXAM:   Vitals:   03/19/23 1511  BP: (!) 155/79  Pulse: 66  Resp: 15  Weight: 228 lb 8 oz (103.6 kg)  Height: 5\' 8"  (1.727 m)      Body mass index is 34.74 kg/m.  PHYSICAL EXAMNIATION:  Gen: NAD, conversant, well nourised, well groomed                     Cardiovascular: Regular rate rhythm, no peripheral edema, warm, nontender. Eyes: Conjunctivae clear without exudates or hemorrhage Neck: Supple, no carotid bruits. Pulmonary: Clear to auscultation bilaterally   NEUROLOGICAL EXAM:  MENTAL STATUS: Speech/cognition: Awake, alert, oriented to history taking and casual conversation CRANIAL NERVES: CN II: Visual fields are full to confrontation. Pupils are round equal and briskly reactive to light. CN III, IV, VI: extraocular movement are normal. No ptosis. CN V: Facial sensation is intact to light touch CN VII: Face is symmetric with normal eye  closure  CN VIII: Hearing is normal to causal conversation. CN IX, X: Phonation is normal. CN XI: Head turning and shoulder shrug are intact  MOTOR: There is no pronator drift of out-stretched arms. Muscle bulk and tone are normal. Muscle strength is normal.  REFLEXES: Reflexes are 2+ and symmetric at the biceps, triceps, knees, and ankles. Plantar responses are flexor.  SENSORY: Intact to light touch, pinprick and vibratory sensation  are intact in fingers and toes.  COORDINATION: There is no trunk or limb dysmetria noted.  GAIT/STANCE: Posture is normal. Gait is steady   REVIEW OF SYSTEMS:  Full 14 system review of systems performed and notable only for as above All other review of systems were negative.   ALLERGIES: No Known Allergies  HOME MEDICATIONS: Current Outpatient Medications  Medication Sig Dispense Refill   clobetasol cream (TEMOVATE) 0.05 % 1 application 2 (two) times daily.      meloxicam (MOBIC) 7.5 MG tablet TAKE 1 TABLET BY MOUTH EVERY DAY 30 tablet 2   sertraline (ZOLOFT) 100 MG tablet Take 100 mg by mouth daily. Takes 50 mg once a day     carbamazepine (TEGRETOL) 200 MG tablet Take 1 tablet (200 mg total) by mouth 2 (two) times daily. 60 tablet 0   No current facility-administered medications for this visit.    PAST MEDICAL HISTORY: Past Medical History:  Diagnosis Date   Arthritis    bilateral knees   Fatigue 09/03/2014   Seasonal allergies    allergic to cat as a child    PAST SURGICAL HISTORY: Past Surgical History:  Procedure Laterality Date   ABDOMINAL HYSTERECTOMY  2002   CESAREAN SECTION  1985, 1990    FAMILY HISTORY: Family History  Problem Relation Age of Onset   COPD Mother    Emphysema Mother    Stroke Mother    COPD Father    Emphysema Father    Diabetes Father    Diabetes Sister    Bipolar disorder Sister    Breast cancer Paternal Aunt    Lung cancer Paternal Grandfather    Hypertension Son    Rectal cancer Neg Hx    Stomach cancer Neg Hx    Esophageal cancer Neg Hx    Colon cancer Neg Hx     SOCIAL HISTORY: Social History   Socioeconomic History   Marital status: Divorced    Spouse name: Not on file   Number of children: Not on file   Years of education: Not on file   Highest education level: Not on file  Occupational History   Not on file  Tobacco Use   Smoking status: Every Day    Types: E-cigarettes   Smokeless tobacco: Never  Vaping  Use   Vaping status: Every Day   Substances: Nicotine, Flavoring  Substance and Sexual Activity   Alcohol use: Yes    Comment: very seldom   Drug use: No   Sexual activity: Not Currently    Birth control/protection: Surgical    Comment: hyst  Other Topics Concern   Not on file  Social History Narrative   Not on file   Social Drivers of Health   Financial Resource Strain: Not on file  Food Insecurity: Not on file  Transportation Needs: Not on file  Physical Activity: Not on file  Stress: Not on file  Social Connections: Not on file  Intimate Partner Violence: Not on file      Levert Feinstein, M.D. Ph.D.  [facility] [phone]  [address], [address] [place], [place] [address] Ph: [phone] Fax: [phone]  CC:  Lianne Moris, PA-C [address] [place],  [place] [address]  Donetta Potts, MD

## 2023-05-13 ENCOUNTER — Other Ambulatory Visit: Payer: Self-pay | Admitting: Orthopedic Surgery

## 2023-09-06 ENCOUNTER — Encounter (INDEPENDENT_AMBULATORY_CARE_PROVIDER_SITE_OTHER): Payer: Self-pay | Admitting: *Deleted

## 2023-11-03 ENCOUNTER — Other Ambulatory Visit: Payer: Self-pay | Admitting: Orthopedic Surgery

## 2023-11-10 ENCOUNTER — Other Ambulatory Visit: Payer: Self-pay | Admitting: Neurology
